# Patient Record
Sex: Male | Born: 1985 | Hispanic: Yes | Marital: Single | Smoking: Current some day smoker
Health system: Southern US, Community
[De-identification: ages and names within clinical notes are randomized; demographics above are authoritative.]

---

## 2019-11-08 ENCOUNTER — Emergency Department
Admission: EM | Admit: 2019-11-08 | Discharge: 2019-11-08 | Disposition: A | Discharge status: Home / Self Care | Payer: Worker's Compensation | Attending: Emergency Medicine | Admitting: Emergency Medicine

## 2019-11-08 ENCOUNTER — Other Ambulatory Visit: Payer: Self-pay

## 2019-11-08 ENCOUNTER — Emergency Department: Payer: Worker's Compensation

## 2019-11-08 DIAGNOSIS — Y939 Activity, unspecified: Secondary | ICD-10-CM | POA: Diagnosis not present

## 2019-11-08 DIAGNOSIS — Z23 Encounter for immunization: Secondary | ICD-10-CM | POA: Insufficient documentation

## 2019-11-08 DIAGNOSIS — Y990 Civilian activity done for income or pay: Secondary | ICD-10-CM | POA: Insufficient documentation

## 2019-11-08 DIAGNOSIS — F17200 Nicotine dependence, unspecified, uncomplicated: Secondary | ICD-10-CM | POA: Insufficient documentation

## 2019-11-08 DIAGNOSIS — S67195A Crushing injury of left ring finger, initial encounter: Secondary | ICD-10-CM | POA: Insufficient documentation

## 2019-11-08 DIAGNOSIS — S61305A Unspecified open wound of left ring finger with damage to nail, initial encounter: Secondary | ICD-10-CM | POA: Insufficient documentation

## 2019-11-08 DIAGNOSIS — W231XXA Caught, crushed, jammed, or pinched between stationary objects, initial encounter: Secondary | ICD-10-CM | POA: Diagnosis not present

## 2019-11-08 DIAGNOSIS — Y9269 Other specified industrial and construction area as the place of occurrence of the external cause: Secondary | ICD-10-CM | POA: Diagnosis not present

## 2019-11-08 DIAGNOSIS — S61309A Unspecified open wound of unspecified finger with damage to nail, initial encounter: Secondary | ICD-10-CM

## 2019-11-08 DIAGNOSIS — S6710XA Crushing injury of unspecified finger(s), initial encounter: Secondary | ICD-10-CM

## 2019-11-08 MED ORDER — LIDOCAINE HCL (PF) 1 % IJ SOLN
5.0000 mL | Freq: Once | INTRAMUSCULAR | Status: AC
  Administered 2019-11-08: 5 mL
  Filled 2019-11-08: qty 5

## 2019-11-08 MED ORDER — TETANUS-DIPHTH-ACELL PERTUSSIS 5-2.5-18.5 LF-MCG/0.5 IM SUSP
0.5000 mL | Freq: Once | INTRAMUSCULAR | Status: AC
  Administered 2019-11-08: 0.5 mL via INTRAMUSCULAR
  Filled 2019-11-08: qty 0.5

## 2019-11-08 MED ORDER — HYDROCODONE-ACETAMINOPHEN 5-325 MG PO TABS: 1.0000 | ORAL_TABLET | Freq: Three times a day (TID) | ORAL | 0 refills | Status: AC | PRN

## 2019-11-08 MED ORDER — BUPIVACAINE HCL (PF) 0.5 % IJ SOLN
10.0000 mL | Freq: Once | INTRAMUSCULAR | Status: AC
  Administered 2019-11-08: 10 mL
  Filled 2019-11-08: qty 10

## 2019-11-08 MED ORDER — HYDROCODONE-ACETAMINOPHEN 5-325 MG PO TABS
1.0000 | ORAL_TABLET | Freq: Once | ORAL | Status: AC
  Administered 2019-11-08: 1 via ORAL
  Filled 2019-11-08: qty 1

## 2019-11-08 NOTE — ED Triage Notes (Signed)
Pt states he got his finger smashed in a machine and tore off his left 4th finger nail.

## 2019-11-08 NOTE — ED Notes (Signed)
Patient reports injuring left ring finger while working for [employer]. Per patient coworker slammed finger in between machine doors. Nail bed avulsion and controlled bleeding noted.

## 2019-11-08 NOTE — ED Provider Notes (Signed)
[facility] Emergency Department Provider Note ____________________________________________  Time seen: 1303  I have reviewed the triage vital signs and the nursing notes.  HISTORY  Chief Complaint  Finger Injury  HPI Frank Franklin is a 34 y.o. male presents to the ED for evaluation of a work-related injury.  Patient describes his left ring finger getting caught in the machine, causing a traumatic avulsion of his fingernail.  He presents with normal gross sensation in the fingernail available for inspection.  Denies any other injury at this time.   History reviewed. No pertinent past medical history.  There are no problems to display for this patient.  History reviewed. No pertinent surgical history.  Prior to Admission medications   Medication Sig Start Date End Date Taking? Authorizing Provider  HYDROcodone-acetaminophen (NORCO) 5-325 MG tablet Take 1 tablet by mouth 3 (three) times daily as needed for up to 2 days. 11/08/19 11/10/19  Benno Brensinger, Charlesetta Ivory, PA-C    Allergies Patient has no known allergies.  History reviewed. No pertinent family history.  Social History Social History   Tobacco Use  . Smoking status: Current Some Day Smoker  . Smokeless tobacco: Never Used  Substance Use Topics  . Alcohol use: Yes  . Drug use: Not Currently    Review of Systems  Constitutional: Negative for fever. Respiratory: Negative for shortness of breath. Musculoskeletal: Negative for back pain.  Left ring finger nail avulsion and crush injury. Skin: Negative for rash. Neurological: Negative for headaches, focal weakness or numbness. ____________________________________________  PHYSICAL EXAM:  VITAL SIGNS: ED Triage Vitals  Enc Vitals Group     BP 11/08/19 1102 (!) 144/92     Pulse Rate 11/08/19 1102 85     Resp 11/08/19 1102 17     Temp 11/08/19 1102 98.9 F (37.2 C)     Temp Source 11/08/19 1102 Oral     SpO2 11/08/19 1102 99 %     Weight  11/08/19 1058 207 lb (93.9 kg)     Height 11/08/19 1058 5\' 9"  (1.753 m)     Head Circumference --      Peak Flow --      Pain Score 11/08/19 1058 8     Pain Loc --      Pain Edu? --      Excl. in GC? --     Constitutional: Alert and oriented. Well appearing and in no distress. Head: Normocephalic and atraumatic. Eyes: Conjunctivae are normal. Normal extraocular movements Cardiovascular: Normal rate, regular rhythm. Normal distal pulses. Respiratory: Normal respiratory effort. No wheezes/rales/rhonchi. Musculoskeletal: Nontender with normal range of motion in all extremities.  Neurologic:  Normal gait without ataxia. Normal speech and language. No gross focal neurologic deficits are appreciated. Skin:  Skin is warm, dry and intact. No rash noted. ___________________________________________   RADIOLOGY  DG Left Ring Finger  IMPRESSION: 1. No acute fracture or dislocation identified about the left fourth digit. 2. Soft tissue swelling. ____________________________________________  PROCEDURES  Norco 5-325 mg PO Tdap 0.5 ml IM  .07/22/21Laceration Repair  Date/Time: 11/08/2019 2:20 PM Performed by: 11/10/2019, PA-C Authorized by: Lissa Hoard, PA-C   Consent:    Consent obtained:  Verbal   Risks discussed:  Pain and poor wound healing Anesthesia (see MAR for exact dosages):    Anesthesia method:  Nerve block   Block location:  Flexor tendon sheath   Block needle gauge:  27 G   Block anesthetic:  Lidocaine 1% w/o epi  and bupivacaine 0.5% w/o epi   Block technique:  Transthecal block   Block injection procedure:  Anatomic landmarks palpated, negative aspiration for blood and incremental injection   Block outcome:  Anesthesia achieved Laceration details:    Location:  Finger   Finger location:  L ring finger   Length (cm):  1.5 Repair type:    Repair type:  Intermediate Pre-procedure details:    Preparation:  Patient was prepped and draped in usual  sterile fashion Treatment:    Area cleansed with:  Betadine and saline   Amount of cleaning:  Standard   Irrigation solution:  Sterile saline   Irrigation method:  Syringe Subcutaneous repair:    Suture size:  6-0   Suture material:  Fast-absorbing gut   Suture technique:  Simple interrupted   Number of sutures:  6 (Nail bed repair) Skin repair:    Repair method:  Sutures   Suture size:  5-0   Suture technique:  Simple interrupted and figure eight   Number of sutures:  1 (to attach native nail as splint) Post-procedure details:    Dressing:  Non-adherent dressing and splint for protection   Patient tolerance of procedure:  Tolerated well, no immediate complications  ____________________________________________  INITIAL IMPRESSION / ASSESSMENT AND PLAN / ED COURSE  Patient with ED evaluation of a crush injury to the left index finger.  He presents with a nail avulsion and nailbed laceration.  X-rays negative for any acute fracture or dislocation.  Nailbed is repaired with dissolvable sutures.  In the native nail was used as a stent.  Patient discharged with wound care instructions and supplies.  He will follow-up with his primary provider return to the ED as needed.  Frank Franklin was evaluated in Emergency Department on 11/08/2019 for the symptoms described in the history of present illness. He was evaluated in the context of the global COVID-19 pandemic, which necessitated consideration that the patient might be at risk for infection with the SARS-CoV-2 virus that causes COVID-19. Institutional protocols and algorithms that pertain to the evaluation of patients at risk for COVID-19 are in a state of rapid change based on information released by regulatory bodies including the CDC and federal and state organizations. These policies and algorithms were followed during the patient's care in the ED.  I reviewed the patient's prescription history over the last 12 months in the multi-state  controlled substances database(s) that includes [place], [place], [place], [place], [place], [place], [place], [place], [place], [place], [place], [place], [place], and [place].  Results were notable for no RX history ____________________________________________  FINAL CLINICAL IMPRESSION(S) / ED DIAGNOSES  Final diagnoses:  Crushing injury of finger, initial encounter  Traumatic avulsion of nail plate of finger, initial encounter      Lissa Hoard, PA-C 11/08/19 1509    Delton [place], MD 11/09/19 [phone]

## 2019-11-08 NOTE — Discharge Instructions (Signed)
Keep the wound clean, dry, and covered. Wash with soap & water daily. Take the pain medicine as needed. Follow-up with [facility] or your provider for removal of the single figure-of-8 stitch in 10-14 days.

## 2019-11-08 NOTE — ED Notes (Signed)
Pt's supervisor in room for employee identification. WC completed by this tech.

## 2021-10-13 IMAGING — DX DG FINGER RING 2+V*L*
3 series · 3 of 3 positions shown · non-contrast
Comparison: None.

CLINICAL DATA: Low left ring finger pain.

EXAM:
LEFT RING FINGER 2+V

[finger ap]
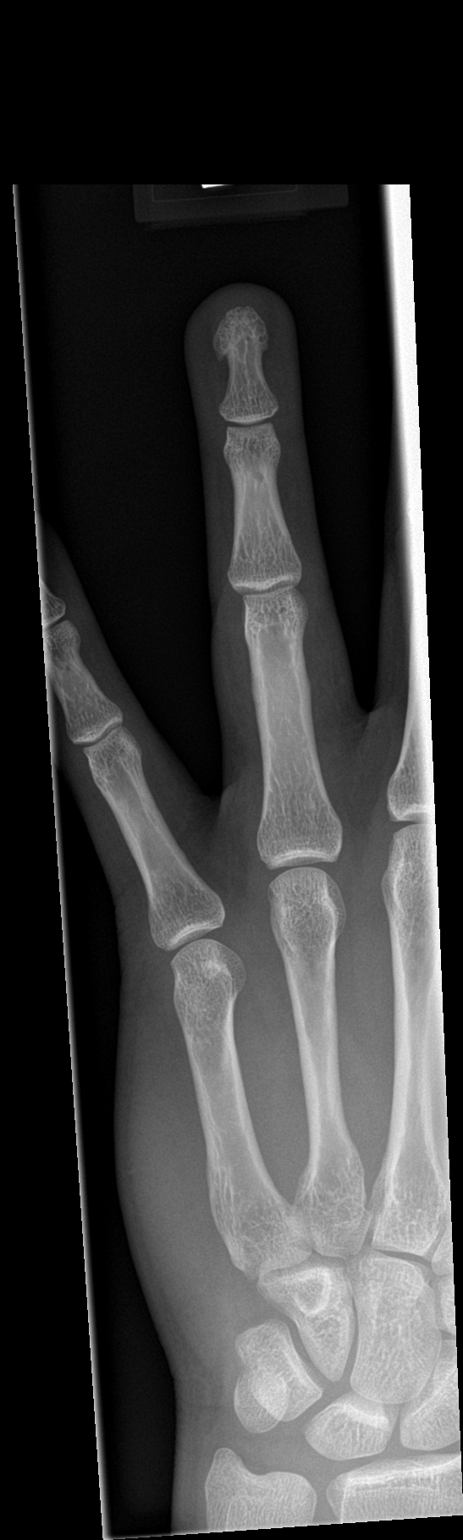

[finger obl]
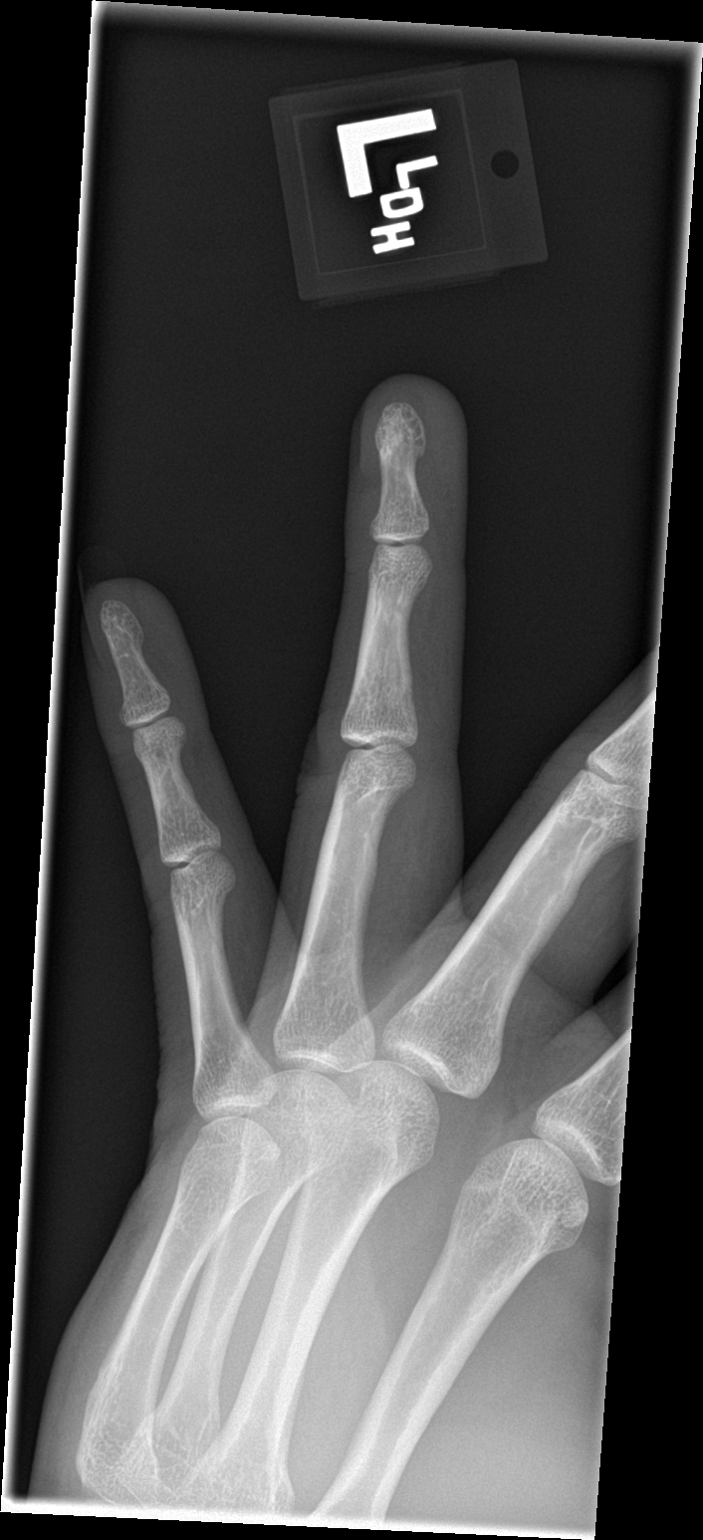

[finger lat]
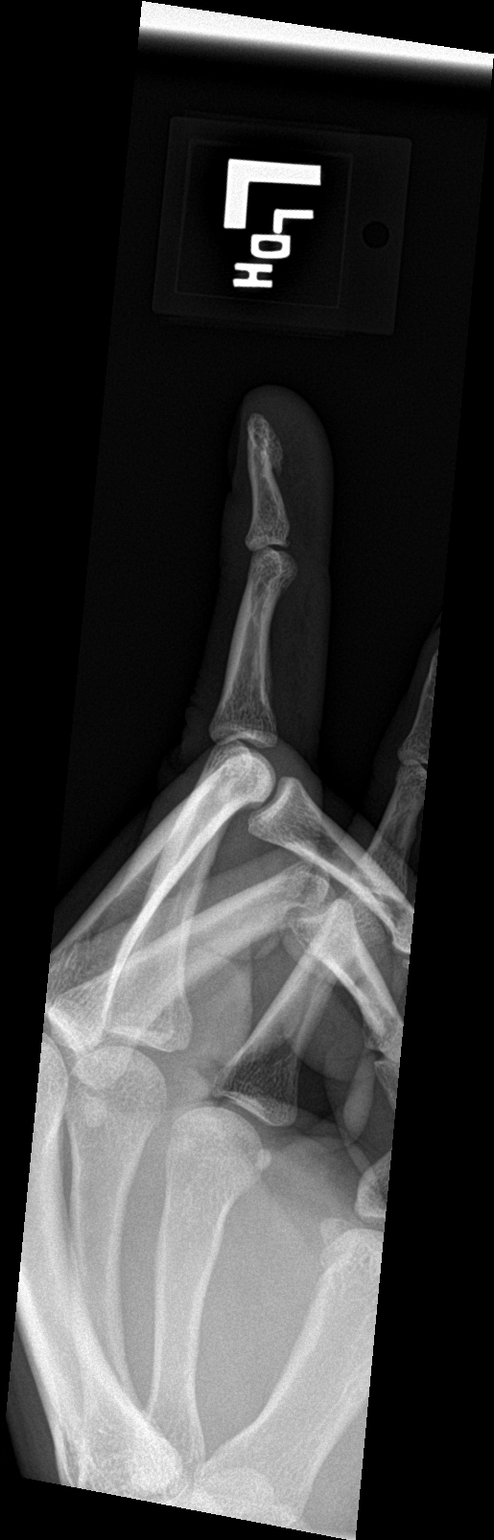

[3 of 3 positions shown; findings below may reference images not displayed]

FINDINGS: There is no evidence of fracture or dislocation. There is no
evidence of arthropathy or other focal bone abnormality. Soft tissue
swelling of the tuft.
IMPRESSION: 1. No acute fracture or dislocation identified about the left fourth
digit.
2. Soft tissue swelling.
